# Patient Record
Sex: Male | Born: 1990 | Hispanic: Yes | Marital: Single | Smoking: Never smoker
Health system: Southern US, Community
[De-identification: ages and names within clinical notes are randomized; demographics above are authoritative.]

## PROBLEM LIST (undated history)

## (undated) DIAGNOSIS — J939 Pneumothorax, unspecified: Secondary | ICD-10-CM

---

## 2014-10-28 ENCOUNTER — Inpatient Hospital Stay: Payer: Self-pay | Admitting: Surgery

## 2014-11-11 ENCOUNTER — Ambulatory Visit
Admit: 2014-11-11 | Disposition: A | Discharge status: Home / Self Care | Payer: Self-pay | Attending: Cardiothoracic Surgery | Admitting: Cardiothoracic Surgery

## 2014-12-26 NOTE — Discharge Summary (Signed)
PATIENT NAME:  Darius Wolfe, [name] MR#:  [id number] DATE OF BIRTH:  04-Nov-1990  DATE OF ADMISSION:  10/28/2014 DATE OF DISCHARGE:  10/31/2014  DIAGNOSIS:  Right spontaneous pneumothorax.   PROCEDURE: Right chest tube placement.  HISTORY OF PRESENT ILLNESS AND HOSPITAL COURSE  This was a patient who was admitted through the Emergency Room for the acute onset of right chest pain with shortness of breath. This was seen the day before he came to the Emergency Room. A chest x-ray was obtained in the ER. Dr. Derrill KayGoodman placed a chest tube for right pneumothorax. His chest re-expanded.   Dr. Westley Gamblesim Oaks was consulted for evaluation in followup and he will see the patient in the office as an outpatient.   Serial x-rays demonstrated good inflation of the lung. No air leak was identified on the last day of the hospitalization and chest x-ray showed that there was no residual pneumothorax. Therefore, the chest tube was removed. He was discharged in stable condition on oral analgesics to follow up in Dr. Thelma Bargeaks' office in 10 days.    ____________________________ Adah Salvageichard E. Excell Seltzerooper, MD rec:LT D: 10/31/2014 16:38:43 ET T: 10/31/2014 18:34:48 ET JOB#: [id number]  cc: Adah Salvageichard E. Excell Seltzerooper, MD, <Dictator> Lattie HawICHARD E COOPER MD ELECTRONICALLY SIGNED 11/08/2014 19:40

## 2014-12-26 NOTE — H&P (Signed)
PATIENT NAME:  Darius Wolfe, Roniel MR#:  [id number] DATE OF BIRTH:  Oct 20, 1990  DATE OF ADMISSION:  10/28/2014  CHIEF COMPLAINT: Right chest pain.   HISTORY OF PRESENT ILLNESS: This is a patient who started having acute chest pain in the right side with shortness of breath last night while working out. He came to the Emergency Room today where a diagnosis of a right pneumothorax was identified. The Emergency Room physician placed a right chest tube with partial re-expansion of the lung on chest x-ray. Patient feels much better now. He has never had an episode like this before.   PAST MEDICAL HISTORY: None.   PAST SURGICAL HISTORY: None.   ALLERGIES: None.   MEDICATIONS: None.   FAMILY HISTORY: No history of pneumothorax or lung cancer.   SOCIAL HISTORY: The patient does not smoke or drink. He works in [employer].   REVIEW OF SYSTEMS: A complete system review was performed and negative with the exception of that mentioned in the HPI.   PHYSICAL EXAMINATION: GENERAL: Healthy, comfortable-appearing male patient.  VITAL SIGNS: Stable. He is afebrile.  HEENT: Shows no scleral icterus.  NECK: No palpable neck nodes.  CHEST: Shows bilateral breath sounds; but slightly diminished on the right, there is a large air leak on chest Pleur-evac.  ABDOMEN: Soft, nontender.  EXTREMITIES: Without edema.  NEUROLOGIC: Grossly intact.  INTEGUMENT: No jaundice.   Chest x-rays are personally reviewed showing partial re-expansion especially the lower lobe but the upper lobe does not fully expand; chest tube appears to be in good position.   No laboratory values are available.   ASSESSMENT AND PLAN: This is a patient with a right chest tube for right pneumothorax, spontaneous in nature, no trauma history. I have recommended placement in the hospital controlling his pain, pulmonary toilet, and Pleur-evac suction to reexpand the lung. I will get x-ray in the morning. If there is no lung re-expansion further than what  has been noted already he may need a second chest tube and/or chest CT scan. I will ask Dr. Westley Gamblesim Oaks to see the patient as well in consultation.    ____________________________ Adah Salvageichard E. Excell Seltzerooper, MD rec:nt D: 10/28/2014 18:33:06 ET T: 10/28/2014 19:09:13 ET JOB#: [id number]  cc: Adah Salvageichard E. Excell Seltzerooper, MD, <Dictator> Lattie HawICHARD E Lenette Rau MD ELECTRONICALLY SIGNED 10/29/2014 10:06

## 2015-09-11 ENCOUNTER — Emergency Department: Payer: Managed Care, Other (non HMO)

## 2015-09-11 ENCOUNTER — Inpatient Hospital Stay
Admission: EM | Admit: 2015-09-11 | Discharge: 2015-09-16 | DRG: 165 | Disposition: A | Discharge status: Home / Self Care | Payer: Managed Care, Other (non HMO) | Attending: Surgery | Admitting: Surgery

## 2015-09-11 ENCOUNTER — Encounter: Payer: Self-pay | Admitting: *Deleted

## 2015-09-11 DIAGNOSIS — J939 Pneumothorax, unspecified: Secondary | ICD-10-CM | POA: Diagnosis present

## 2015-09-11 DIAGNOSIS — Z09 Encounter for follow-up examination after completed treatment for conditions other than malignant neoplasm: Secondary | ICD-10-CM

## 2015-09-11 DIAGNOSIS — J930 Spontaneous tension pneumothorax: Principal | ICD-10-CM | POA: Diagnosis present

## 2015-09-11 HISTORY — DX: Pneumothorax, unspecified: J93.9

## 2015-09-11 LAB — CBC
HEMATOCRIT: 48.6 % (ref 40.0–52.0)
Hemoglobin: 16.8 g/dL (ref 13.0–18.0)
MCH: 30.8 pg (ref 26.0–34.0)
MCHC: 34.5 g/dL (ref 32.0–36.0)
MCV: 89.3 fL (ref 80.0–100.0)
PLATELETS: 315 10*3/uL (ref 150–440)
RBC: 5.44 MIL/uL (ref 4.40–5.90)
RDW: 12.3 % (ref 11.5–14.5)
WBC: 5.7 10*3/uL (ref 3.8–10.6)

## 2015-09-11 LAB — BASIC METABOLIC PANEL
Anion gap: 8 (ref 5–15)
BUN: 11 mg/dL (ref 6–20)
CHLORIDE: 105 mmol/L (ref 101–111)
CO2: 28 mmol/L (ref 22–32)
Calcium: 9.3 mg/dL (ref 8.9–10.3)
Creatinine, Ser: 1.01 mg/dL (ref 0.61–1.24)
Glucose, Bld: 106 mg/dL — ABNORMAL HIGH (ref 65–99)
POTASSIUM: 4.1 mmol/L (ref 3.5–5.1)
SODIUM: 141 mmol/L (ref 135–145)

## 2015-09-11 LAB — TROPONIN I: Troponin I: <0.03 ng/mL (ref <0.031)

## 2015-09-11 MED ORDER — MORPHINE SULFATE (PF) 2 MG/ML IV SOLN: 2.0000 mg | INTRAVENOUS | Status: DC | PRN

## 2015-09-11 MED ORDER — CEFAZOLIN SODIUM 1-5 GM-% IV SOLN
1.0000 g | Freq: Once | INTRAVENOUS | Status: AC
  Administered 2015-09-11: 1 g via INTRAVENOUS
  Filled 2015-09-11: qty 50

## 2015-09-11 MED ORDER — MORPHINE SULFATE (PF) 2 MG/ML IV SOLN
INTRAVENOUS | Status: AC
  Filled 2015-09-11: qty 1

## 2015-09-11 MED ORDER — MORPHINE SULFATE (PF) 2 MG/ML IV SOLN: 2.0000 mg | Freq: Once | INTRAVENOUS | Status: DC

## 2015-09-11 MED ORDER — SODIUM CHLORIDE 0.9 % IV SOLN: 250.0000 mL | INTRAVENOUS | Status: DC | PRN

## 2015-09-11 MED ORDER — MORPHINE SULFATE (PF) 2 MG/ML IV SOLN
2.0000 mg | Freq: Once | INTRAVENOUS | Status: AC
  Administered 2015-09-11: 2 mg via INTRAVENOUS

## 2015-09-11 MED ORDER — OXYCODONE HCL 5 MG PO TABS: 5.0000 mg | ORAL_TABLET | ORAL | Status: DC | PRN

## 2015-09-11 MED ORDER — SODIUM CHLORIDE 0.9 % IJ SOLN
3.0000 mL | Freq: Two times a day (BID) | INTRAMUSCULAR | Status: DC
  Administered 2015-09-11 – 2015-09-12 (×3): 3 mL via INTRAVENOUS

## 2015-09-11 MED ORDER — ONDANSETRON HCL 4 MG/2ML IJ SOLN
4.0000 mg | Freq: Once | INTRAMUSCULAR | Status: AC
  Administered 2015-09-11: 4 mg via INTRAVENOUS
  Filled 2015-09-11: qty 2

## 2015-09-11 MED ORDER — SODIUM CHLORIDE 0.9 % IJ SOLN: 3.0000 mL | INTRAMUSCULAR | Status: DC | PRN

## 2015-09-11 MED ORDER — LIDOCAINE-EPINEPHRINE (PF) 1 %-1:200000 IJ SOLN
INTRAMUSCULAR | Status: AC
  Administered 2015-09-11: 30 mL
  Filled 2015-09-11: qty 30

## 2015-09-11 NOTE — H&P (Signed)
Darius Wolfe is an 25 y.o. male.    Chief Complaint: Right pneumothorax  HPI: This patient with a recurrent right pneumothorax he had the acute onset of chest pain yesterday he's had this happen before and in fact required a chest tube placed by [facility] surgical last year. He has some shortness of breath and chest pain. No hemoptysis.  No past medical history on file.  No past surgical history on file.  No family history on file. Social History:  has no tobacco, alcohol, and drug history on file.  Allergies: No Known Allergies   (Not in a hospital admission)   Review of Systems  Constitutional: Negative for fever and chills.  HENT: Negative.   Eyes: Negative.   Respiratory: Positive for shortness of breath. Negative for cough, hemoptysis and sputum production.   Cardiovascular: Positive for chest pain. Negative for palpitations and orthopnea.       Right chest pain  Gastrointestinal: Negative.   Genitourinary: Negative.   Musculoskeletal: Negative.   Skin: Negative.   Neurological: Negative.   Endo/Heme/Allergies: Negative.   Psychiatric/Behavioral: Negative.      Physical Exam:  BP 118/65 mmHg  Pulse 73  Temp(Src) 98.1 F (36.7 C) (Oral)  Resp 15  Ht 5' 6"  (1.676 m)  Wt 156 lb (70.761 kg)  BMI 25.19 kg/m2  SpO2 100%  Physical Exam  Constitutional: He is oriented to person, place, and time and well-developed, well-nourished, and in no distress. No distress.  Appears uncomfortable  HENT:  Head: Normocephalic and atraumatic.  Eyes: Pupils are equal, round, and reactive to light. Right eye exhibits no discharge. Left eye exhibits no discharge. No scleral icterus.  Neck: Normal range of motion. Neck supple.  Cardiovascular: Normal rate, regular rhythm and normal heart sounds.   Pulmonary/Chest: Effort normal and breath sounds normal. No respiratory distress. He has no wheezes. He has no rales.  Right chest wall scar  Abdominal: Soft. He exhibits no distension. There  is no tenderness. There is no rebound.  Musculoskeletal: Normal range of motion. He exhibits no edema.  Lymphadenopathy:    He has no cervical adenopathy.  Neurological: He is alert and oriented to person, place, and time.  Skin: Skin is warm and dry. He is not diaphoretic. No erythema.  Psychiatric: Mood and affect normal.  Vitals reviewed.       Results for orders placed or performed during the hospital encounter of 09/11/15 (from the past 48 hour(s))  Basic metabolic panel     Status: Abnormal   Collection Time: 09/11/15  1:35 PM  Result Value Ref Range   Sodium 141 135 - 145 mmol/L   Potassium 4.1 3.5 - 5.1 mmol/L   Chloride 105 101 - 111 mmol/L   CO2 28 22 - 32 mmol/L   Glucose, Bld 106 (H) 65 - 99 mg/dL   BUN 11 6 - 20 mg/dL   Creatinine, Ser 1.01 0.61 - 1.24 mg/dL   Calcium 9.3 8.9 - 10.3 mg/dL   GFR calc non Af Amer >60 >60 mL/min   GFR calc Af Amer >60 >60 mL/min    Comment: (NOTE) The eGFR has been calculated using the CKD EPI equation. This calculation has not been validated in all clinical situations. eGFR's persistently <60 mL/min signify possible Chronic Kidney Disease.    Anion gap 8 5 - 15  CBC     Status: None   Collection Time: 09/11/15  1:35 PM  Result Value Ref Range   WBC 5.7 3.8 -  10.6 K/uL   RBC 5.44 4.40 - 5.90 MIL/uL   Hemoglobin 16.8 13.0 - 18.0 g/dL   HCT 48.6 40.0 - 52.0 %   MCV 89.3 80.0 - 100.0 fL   MCH 30.8 26.0 - 34.0 pg   MCHC 34.5 32.0 - 36.0 g/dL   RDW 12.3 11.5 - 14.5 %   Platelets 315 150 - 440 K/uL  Troponin I     Status: None   Collection Time: 09/11/15  1:35 PM  Result Value Ref Range   Troponin I <0.03 <0.031 ng/mL    Comment:        NO INDICATION OF MYOCARDIAL INJURY.    Dg Chest 2 View  09/11/2015  CLINICAL DATA:  Chest tightness since 2200 hours last night, pressure on RIGHT side of head and neck, history of prior pneumothorax in March 2016 EXAM: CHEST  2 VIEW COMPARISON:  Multiple prior exams from March 2016  FINDINGS: Normal heart size and mediastinal contours. Large RIGHT pneumothorax with near complete collapse of RIGHT lung. Minimal RIGHT to LEFT mediastinal shift. LEFT lung clear. No pleural effusion or acute osseous findings. IMPRESSION: Large RIGHT pneumothorax with near complete collapse of RIGHT lung. Minimal RIGHT to LEFT midline shift suggesting development of tension pneumothorax. Critical Value/emergent results were called by telephone at the time of interpretation on 09/11/2015 at 2:50 pm to Dr. Lisa Roca, who verbally acknowledged these results. Electronically Signed   By: Lavonia Dana M.D.   On: 09/11/2015 14:52     Assessment/Plan  Current right pneumothorax. Patient had this happen last year requiring a chest tube. He did never met Dr. Faith Rogue at that time. Currently he has a tension pneumothorax and Dr. Rip Harbour is placing a chest tube. Was present during the chest tube placement and have reviewed his postoperative chest film with good inflation of his right lung. This is a recurrent right pneumothorax he will likely require a VATS and pleurodesis. I will admit him to the hospital and maintain chest tube suction and asked Dr. Faith Rogue to see the patient for consideration or evaluation for possible surgery to prevent recurrence. I discussed with the patient the rationale for this and the fact that this is a life-threatening condition untreated and that it should be addressed for permanent long-standing prevention and care  Florene Glen, MD, FACS

## 2015-09-11 NOTE — ED Provider Notes (Signed)
[facility] Emergency Department Provider Note  ____________________________________________  Time seen: Approximately 3:56 PM  I have reviewed the triage vital signs and the nursing notes.   HISTORY  Chief Complaint Chest Pain    HPI Nathaniel ManJuan Diefendorf is a 25 y.o. male comes in complaining of chest pain. He says it feels something like when he had a pneumothorax spontaneous pneumothorax in the right side before. Chest x-ray was done and showed a large right-sided spontaneous pneumothorax. Consent was obtained patient was prepped in usual sterile fashion area was anesthetized with 1% lidocaine with epi incision was made and large Tresa EndoKelly was used to bluntly dissect down over the rib into the chest rush of air was obtained the chest tube was inserted patient tolerated very well. Wound was sutured closed chest tube was tube was sutured into the wound and the chest tube was 2 suction.   No past medical history on file.  Past medical history significant only for right-sided spontaneous pneumothorax  Patient Active Problem List   Diagnosis Date Noted  . Pneumothorax on right 09/11/2015    No past surgical history on file.  No current outpatient prescriptions on file.  Allergies Review of patient's allergies indicates no known allergies.  No family history on file.  Social History Social History  Substance Use Topics  . Smoking status: Not on file  . Smokeless tobacco: Not on file  . Alcohol Use: Not on file   Patient does not smoke Review of Systems Constitutional: No fever/chills Eyes: No visual changes. ENT: No sore throat. Cardiovascular: See history of present illness Respiratory: Denies shortness of breath! Gastrointestinal: No abdominal pain.  No nausea, no vomiting.  No diarrhea.  No constipation. Genitourinary: Negative for dysuria. Musculoskeletal: Negative for back pain. Skin: Negative for rash. Neurological: Negative for headaches, focal  weakness or numbness.  10-point ROS otherwise negative.  ____________________________________________   PHYSICAL EXAM:  VITAL SIGNS: ED Triage Vitals  Enc Vitals Group     BP 09/11/15 1300 136/88 mmHg     Pulse Rate 09/11/15 1300 70     Resp 09/11/15 1300 16     Temp 09/11/15 1300 98.1 F (36.7 C)     Temp Source 09/11/15 1300 Oral     SpO2 09/11/15 1300 98 %     Weight 09/11/15 1300 156 lb (70.761 kg)     Height 09/11/15 1300 5\' 6"  (1.676 m)     Head Cir --      Peak Flow --      Pain Score 09/11/15 1329 2     Pain Loc --      Pain Edu? --      Excl. in GC? --     Constitutional: Alert and oriented. Well appearing and in no acute distress. Eyes: Conjunctivae are normal. PERRL. EOMI. Head: Atraumatic. Nose: No congestion/rhinnorhea. Mouth/Throat: Mucous membranes are moist.  Oropharynx non-erythematous. Neck: No stridor.  Cardiovascular: Normal rate, regular rhythm. Grossly normal heart sounds.  Good peripheral circulation. Respiratory: Normal respiratory effort.  No retractions. Lungs CTAB. Gastrointestinal: Soft and nontender. No distention. No abdominal bruits. No CVA tenderness. Musculoskeletal: No lower extremity tenderness nor edema.  No joint effusions. Neurologic:  Normal speech and language. No gross focal neurologic deficits are appreciated. No gait instability. Skin:  Skin is warm, dry and intact. No rash noted. Psychiatric: Mood and affect are normal. Speech and behavior are normal.  ____________________________________________   LABS (all labs ordered are listed, but only abnormal results are displayed)  Labs Reviewed  BASIC METABOLIC PANEL - Abnormal; Notable for the following:    Glucose, Bld 106 (*)    All other components within normal limits  CBC  TROPONIN I   ____________________________________________  EKG  EKG read and interpreted by me shows normal sinus rhythm rate of 96 normal axis essentially normal  EKG ____________________________________________  RADIOLOGY  Chest x-ray read by radiology reviewed by me shows large right pneumothorax the radiologist is concerned about early tension pneumothorax  Chest x-ray post procedure shows resolution of the pneumothorax with good placement chest tube ____________________________________________   PROCEDURES  See history of present illness for procedure ne  ____________________________________________   INITIAL IMPRESSION / ASSESSMENT AND PLAN / ED COURSE  Pertinent labs & imaging results that were available during my care of the patient were reviewed by me and considered in my medical decision making (see chart for details).   ____________________________________________   FINAL CLINICAL IMPRESSION(S) / ED DIAGNOSES  Final diagnoses:  Pneumothorax on right       Arnaldo Natal, MD 09/11/15 1718

## 2015-09-11 NOTE — ED Notes (Addendum)
Pt reports chest tightness that started this morning around 1030. Increased pain with inspiration. Pt reports last year he had a collapsed right lung. Denies current cold symptoms

## 2015-09-12 ENCOUNTER — Inpatient Hospital Stay: Payer: Managed Care, Other (non HMO)

## 2015-09-12 NOTE — Progress Notes (Signed)
Patient ID: Darius Wolfe, male   DOB: [birth year], 25 y.o.   MRN: [id number]  Chief Complaint  Patient presents with  . Chest Pain    Referred By Dr. Lanelle Balick Cooper Reason for Referral recurrent right sided spontaneous pneumothorax  HPI Location, Quality, Duration, Severity, Timing, Context, Modifying Factors, Associated Signs and Symptoms.  Darius ManJuan Pohle is a 25 y.o. male.  This 25 year old gentleman was in his usual state of health when he was walking across to his room. He experienced the acute onset of some shortness of breath and chest pressure. After a couple minutes he also felt that his neck was swelling and presented to the emergency room where a large right-sided pneumothorax was diagnosed. This was treated with a chest tube with prompt resolution of his symptoms. He had a similar episode about one year ago when he was working out lifting weights and experienced acute onset of right-sided chest pain. At that time he was treated with a chest tube. His lung eventually expanded and he was discharged and has been well ever since. At the current time he denies any chest pain. He denies any shortness of breath. He has had no fevers or chills. He did have a chest x-ray and a CT scan. The CT scan does not show any evidence of blood disease. There is no other pathology present.   Past Medical History  Diagnosis Date  . Pneumothorax     1 year ago    History reviewed. No pertinent past surgical history.  History reviewed. No pertinent family history.  Social History Social History  Substance Use Topics  . Smoking status: Never Smoker   . Smokeless tobacco: None  . Alcohol Use: No    No Known Allergies  Current Facility-Administered Medications  Medication Dose Route Frequency Provider Last Rate Last Dose  . 0.9 %  sodium chloride infusion  250 mL Intravenous PRN Lattie Hawichard E Cooper, MD      . morphine 2 MG/ML injection 2 mg  2 mg Intravenous Once Lattie Hawichard E Cooper, MD   2 mg at 09/11/15 1732   . morphine 2 MG/ML injection 2 mg  2 mg Intravenous Q2H PRN Lattie Hawichard E Cooper, MD      . oxyCODONE (Oxy IR/ROXICODONE) immediate release tablet 5 mg  5 mg Oral Q4H PRN Lattie Hawichard E Cooper, MD      . sodium chloride 0.9 % injection 3 mL  3 mL Intravenous Q12H Lattie Hawichard E Cooper, MD   3 mL at 09/12/15 1000  . sodium chloride 0.9 % injection 3 mL  3 mL Intravenous PRN Lattie Hawichard E Cooper, MD          Review of Systems A complete review of systems was asked and was negative except for the following positive findings acute onset of shortness of breath, chest pressure and a feeling of pain in his neck. All this has resolved with the chest tube.  Blood pressure 125/64, pulse 70, temperature 98.1 F (36.7 C), temperature source Oral, resp. rate 17, height 5\' 6"  (1.676 m), weight 156 lb (70.761 kg), SpO2 100 %.  Physical Exam CONSTITUTIONAL:  Pleasant, well-developed, well-nourished, and in no acute distress. EYES: Pupils equal and reactive to light, Sclera non-icteric EARS, NOSE, MOUTH AND THROAT:  The oropharynx was clear.  Dentition is good repair.  Oral mucosa pink and moist. LYMPH NODES:  Lymph nodes in the neck and axillae were normal RESPIRATORY:  Lungs were clear.  Normal respiratory effort without pathologic use of accessory muscles of  respiration CARDIOVASCULAR: Heart was regular without murmurs.  There were no carotid bruits. MUSCULOSKELETAL:  Normal muscle strength and tone.  No clubbing or cyanosis.   SKIN:  There were no pathologic skin lesions.  There were no nodules on palpation. NEUROLOGIC:  Sensation is normal.  Cranial nerves are grossly intact. PSYCH:  Oriented to person, place and time.  Mood and affect are normal.  Data Reviewed I have independently reviewed the chest x-ray and CT scan  I have personally reviewed the patient's imaging, laboratory findings and medical records.    Assessment    Recurrent right-sided spontaneous pneumothorax.    Plan    I had a long  discussion today with the patient and his father. His mother and grandmother were present in the room but did not speak [place]. He and his father understand English well. I reviewed with him the indications and risks of right-sided thoracoscopy possible thoracotomy with apical blebectomy and mechanical pleurodesis. He was apprised of the options. Given that this is his second episode he would like proceed with surgical intervention. We will plan on performing that tomorrow.       Hulda Marin, MD 09/12/2015, 6:29 PM

## 2015-09-13 ENCOUNTER — Inpatient Hospital Stay: Payer: Managed Care, Other (non HMO)

## 2015-09-13 ENCOUNTER — Encounter
Admission: EM | Disposition: A | Discharge status: Home / Self Care | Payer: Self-pay | Source: Home / Self Care | Attending: Surgery

## 2015-09-13 ENCOUNTER — Inpatient Hospital Stay: Payer: Managed Care, Other (non HMO) | Admitting: Anesthesiology

## 2015-09-13 ENCOUNTER — Encounter: Payer: Self-pay | Admitting: *Deleted

## 2015-09-13 DIAGNOSIS — J939 Pneumothorax, unspecified: Secondary | ICD-10-CM

## 2015-09-13 HISTORY — PX: VIDEO ASSISTED THORACOSCOPY: SHX5073

## 2015-09-13 LAB — MRSA PCR SCREENING: MRSA BY PCR: NEGATIVE

## 2015-09-13 SURGERY — VIDEO ASSISTED THORACOSCOPY
Anesthesia: General | Laterality: Right | Wound class: Clean Contaminated

## 2015-09-13 MED ORDER — ACETAMINOPHEN 10 MG/ML IV SOLN
INTRAVENOUS | Status: AC
Start: 2015-09-13 — End: 2015-09-13
  Filled 2015-09-13: qty 100

## 2015-09-13 MED ORDER — BISACODYL 5 MG PO TBEC
10.0000 mg | DELAYED_RELEASE_TABLET | Freq: Every day | ORAL | Status: DC
  Administered 2015-09-13 – 2015-09-15 (×3): 10 mg via ORAL
  Filled 2015-09-13 (×3): qty 2

## 2015-09-13 MED ORDER — BUPIVACAINE HCL (PF) 0.25 % IJ SOLN
INTRAMUSCULAR | Status: DC | PRN
  Administered 2015-09-13: 20 mL

## 2015-09-13 MED ORDER — ALBUTEROL SULFATE (2.5 MG/3ML) 0.083% IN NEBU
2.5000 mg | INHALATION_SOLUTION | RESPIRATORY_TRACT | Status: DC
  Administered 2015-09-13 – 2015-09-14 (×2): 2.5 mg via RESPIRATORY_TRACT
  Filled 2015-09-13 (×2): qty 3

## 2015-09-13 MED ORDER — ALBUTEROL SULFATE (2.5 MG/3ML) 0.083% IN NEBU: 2.5000 mg | INHALATION_SOLUTION | RESPIRATORY_TRACT | Status: DC

## 2015-09-13 MED ORDER — SUGAMMADEX SODIUM 200 MG/2ML IV SOLN
INTRAVENOUS | Status: DC | PRN
  Administered 2015-09-13: 150 mg via INTRAVENOUS

## 2015-09-13 MED ORDER — PROPOFOL 10 MG/ML IV BOLUS
INTRAVENOUS | Status: DC | PRN
  Administered 2015-09-13: 200 mg via INTRAVENOUS

## 2015-09-13 MED ORDER — BUPIVACAINE LIPOSOME 1.3 % IJ SUSP
INTRAMUSCULAR | Status: AC
  Filled 2015-09-13: qty 20

## 2015-09-13 MED ORDER — FENTANYL CITRATE (PF) 100 MCG/2ML IJ SOLN
INTRAMUSCULAR | Status: DC | PRN
  Administered 2015-09-13 (×2): 50 ug via INTRAVENOUS
  Administered 2015-09-13: 100 ug via INTRAVENOUS
  Administered 2015-09-13 (×4): 50 ug via INTRAVENOUS

## 2015-09-13 MED ORDER — TALC 5 G PL SUSR
INTRAPLEURAL | Status: AC
  Filled 2015-09-13: qty 5

## 2015-09-13 MED ORDER — MIDAZOLAM HCL 2 MG/2ML IJ SOLN
INTRAMUSCULAR | Status: DC | PRN
  Administered 2015-09-13: 2 mg via INTRAVENOUS

## 2015-09-13 MED ORDER — SODIUM CHLORIDE 0.9 % IJ SOLN
INTRAMUSCULAR | Status: AC
  Filled 2015-09-13: qty 50

## 2015-09-13 MED ORDER — LACTATED RINGERS IV SOLN
INTRAVENOUS | Status: DC
  Administered 2015-09-13: 13:00:00 via INTRAVENOUS

## 2015-09-13 MED ORDER — LIDOCAINE HCL (CARDIAC) 20 MG/ML IV SOLN
INTRAVENOUS | Status: DC | PRN
  Administered 2015-09-13: 50 mg via INTRAVENOUS

## 2015-09-13 MED ORDER — DEXTROSE-NACL 5-0.45 % IV SOLN
INTRAVENOUS | Status: DC
  Administered 2015-09-14 (×2): via INTRAVENOUS

## 2015-09-13 MED ORDER — ONDANSETRON HCL 4 MG/2ML IJ SOLN: 4.0000 mg | Freq: Four times a day (QID) | INTRAMUSCULAR | Status: DC | PRN

## 2015-09-13 MED ORDER — ACETAMINOPHEN 10 MG/ML IV SOLN
INTRAVENOUS | Status: DC | PRN
  Administered 2015-09-13: 1000 mg via INTRAVENOUS

## 2015-09-13 MED ORDER — OXYCODONE HCL 5 MG PO TABS
5.0000 mg | ORAL_TABLET | ORAL | Status: DC | PRN
  Administered 2015-09-13 – 2015-09-14 (×2): 10 mg via ORAL
  Filled 2015-09-13 (×2): qty 2

## 2015-09-13 MED ORDER — LACTATED RINGERS IV SOLN
INTRAVENOUS | Status: DC | PRN
  Administered 2015-09-13: 13:00:00 via INTRAVENOUS

## 2015-09-13 MED ORDER — ROCURONIUM BROMIDE 100 MG/10ML IV SOLN
INTRAVENOUS | Status: DC | PRN
  Administered 2015-09-13: 50 mg via INTRAVENOUS
  Administered 2015-09-13 (×2): 10 mg via INTRAVENOUS
  Administered 2015-09-13: 20 mg via INTRAVENOUS

## 2015-09-13 MED ORDER — ONDANSETRON HCL 4 MG/2ML IJ SOLN
INTRAMUSCULAR | Status: DC | PRN
  Administered 2015-09-13 (×2): 4 mg via INTRAVENOUS

## 2015-09-13 MED ORDER — HYDROMORPHONE HCL 1 MG/ML IJ SOLN: 0.2500 mg | INTRAMUSCULAR | Status: DC | PRN

## 2015-09-13 MED ORDER — MINERAL OIL LIGHT 100 % EX OIL
TOPICAL_OIL | CUTANEOUS | Status: AC
  Filled 2015-09-13: qty 25

## 2015-09-13 MED ORDER — BUPIVACAINE HCL (PF) 0.25 % IJ SOLN
INTRAMUSCULAR | Status: AC
  Filled 2015-09-13: qty 30

## 2015-09-13 MED ORDER — MORPHINE SULFATE (PF) 2 MG/ML IV SOLN
1.0000 mg | INTRAVENOUS | Status: DC | PRN
  Administered 2015-09-13: 1 mg via INTRAVENOUS
  Administered 2015-09-16: 2 mg via INTRAVENOUS
  Filled 2015-09-13: qty 1
  Filled 2015-09-13: qty 2

## 2015-09-13 MED ORDER — DEXAMETHASONE SODIUM PHOSPHATE 4 MG/ML IJ SOLN
INTRAMUSCULAR | Status: DC | PRN
  Administered 2015-09-13: 5 mg via INTRAVENOUS

## 2015-09-13 MED ORDER — DEXTROSE 5 % IV SOLN
1.5000 g | Freq: Two times a day (BID) | INTRAVENOUS | Status: AC
  Administered 2015-09-13 – 2015-09-14 (×2): 1.5 g via INTRAVENOUS
  Filled 2015-09-13 (×2): qty 1.5

## 2015-09-13 MED ORDER — FENTANYL CITRATE (PF) 100 MCG/2ML IJ SOLN: 25.0000 ug | INTRAMUSCULAR | Status: DC | PRN

## 2015-09-13 MED ORDER — ONDANSETRON HCL 4 MG/2ML IJ SOLN: 4.0000 mg | Freq: Once | INTRAMUSCULAR | Status: DC | PRN

## 2015-09-13 MED ORDER — CEFAZOLIN SODIUM 1 G IJ SOLR
INTRAMUSCULAR | Status: DC | PRN
  Administered 2015-09-13: 2 g via INTRAMUSCULAR

## 2015-09-13 SURGICAL SUPPLY — 68 items
BNDG COHESIVE 4X5 TAN STRL (GAUZE/BANDAGES/DRESSINGS) IMPLANT
BRONCHOSCOPE PED SLIM DISP (MISCELLANEOUS) ×2 IMPLANT
CANISTER SUCT 1200ML W/VALVE (MISCELLANEOUS) ×2 IMPLANT
CATH THOR STR 28F  SOFT WA (CATHETERS) ×1
CATH THOR STR 28F SOFT WA (CATHETERS) ×1 IMPLANT
CATH TRAY 16F METER LATEX (MISCELLANEOUS) IMPLANT
CATH URET ROBINSON 16FR STRL (CATHETERS) IMPLANT
CHLORAPREP W/TINT 26ML (MISCELLANEOUS) ×4 IMPLANT
CNTNR SPEC 2.5X3XGRAD LEK (MISCELLANEOUS)
CONT SPEC 4OZ STER OR WHT (MISCELLANEOUS)
CONTAINER SPEC 2.5X3XGRAD LEK (MISCELLANEOUS) IMPLANT
CUTTER ECHEON FLEX ENDO 45 340 (ENDOMECHANICALS) ×2 IMPLANT
DEFOGGER SCOPE WARMER CLEARIFY (MISCELLANEOUS) ×2 IMPLANT
DRAIN CHEST DRY SUCT SGL (MISCELLANEOUS) ×2 IMPLANT
DRAPE C-SECTION (MISCELLANEOUS) ×2 IMPLANT
DRAPE MAG INST 16X20 L/F (DRAPES) IMPLANT
DRESSING TELFA 4X3 1S ST N-ADH (GAUZE/BANDAGES/DRESSINGS) IMPLANT
DRSG TEGADERM 2-3/8X2-3/4 SM (GAUZE/BANDAGES/DRESSINGS) IMPLANT
DRSG TEGADERM 6X8 (GAUZE/BANDAGES/DRESSINGS) IMPLANT
DRSG TELFA 3X8 NADH (GAUZE/BANDAGES/DRESSINGS) IMPLANT
ELECT BLADE 6 FLAT ULTRCLN (ELECTRODE) ×2 IMPLANT
ELECT BLADE 6.5 EXT (BLADE) ×2 IMPLANT
ELECT CAUTERY BLADE TIP 2.5 (TIP) ×2
ELECT REM PT RETURN 9FT ADLT (ELECTROSURGICAL) ×2
ELECTRODE CAUTERY BLDE TIP 2.5 (TIP) ×1 IMPLANT
ELECTRODE REM PT RTRN 9FT ADLT (ELECTROSURGICAL) ×1 IMPLANT
GAUZE SPONGE 4X4 12PLY STRL (GAUZE/BANDAGES/DRESSINGS) ×2 IMPLANT
GLOVE EXAM LX STRL 7.5 (GLOVE) ×12 IMPLANT
GOWN STRL REUS W/ TWL LRG LVL3 (GOWN DISPOSABLE) ×4 IMPLANT
GOWN STRL REUS W/TWL LRG LVL3 (GOWN DISPOSABLE) ×4
KIT RM TURNOVER STRD PROC AR (KITS) ×2 IMPLANT
LABEL OR SOLS (LABEL) IMPLANT
LOOP RED MAXI  1X406MM (MISCELLANEOUS)
LOOP VESSEL MAXI 1X406 RED (MISCELLANEOUS) IMPLANT
MARKER SKIN W/RULER 31145785 (MISCELLANEOUS) ×2 IMPLANT
NEEDLE SPNL 20GX3.5 QUINCKE YW (NEEDLE) ×2 IMPLANT
NS IRRIG 500ML POUR BTL (IV SOLUTION) ×2 IMPLANT
PACK BASIN MAJOR ARMC (MISCELLANEOUS) ×2 IMPLANT
RELOAD GOLD ECHELON 45 (STAPLE) ×6 IMPLANT
RELOAD STAPLER LINE PROX 30 GR (STAPLE) IMPLANT
SCISSORS METZENBAUM CVD 33 (INSTRUMENTS) ×2 IMPLANT
SPONGE KITTNER 5P (MISCELLANEOUS) ×2 IMPLANT
STAPLER RELOAD LINE PROX 30 GR (STAPLE)
STAPLER SKIN PROX 35W (STAPLE) IMPLANT
STAPLER VASCULAR ECHELON 35 (CUTTER) IMPLANT
STRIP CLOSURE SKIN 1/2X4 (GAUZE/BANDAGES/DRESSINGS) ×2 IMPLANT
SUT ETHILON 4-0 (SUTURE) ×1
SUT ETHILON 4-0 FS2 18XMFL BLK (SUTURE) ×1
SUT SILK 0 (SUTURE)
SUT SILK 0 30XBRD TIE 6 (SUTURE) IMPLANT
SUT SILK 1 SH (SUTURE) ×10 IMPLANT
SUT VIC AB 0 CT1 36 (SUTURE) ×4 IMPLANT
SUT VIC AB 2-0 CT1 27 (SUTURE)
SUT VIC AB 2-0 CT1 TAPERPNT 27 (SUTURE) IMPLANT
SUT VIC AB 2-0 CT2 27 (SUTURE) ×4 IMPLANT
SUT VIC AB 4-0 FS2 27 (SUTURE) IMPLANT
SUT VICRYL 2 TP 1 (SUTURE) IMPLANT
SUTURE ETHLN 4-0 FS2 18XMF BLK (SUTURE) ×1 IMPLANT
SYR 10ML SLIP (SYRINGE) IMPLANT
SYR 50ML LL SCALE MARK (SYRINGE) ×2 IMPLANT
SYR BULB IRRIG 60ML STRL (SYRINGE) ×2 IMPLANT
TAPE ADH 3 LX (MISCELLANEOUS) ×2 IMPLANT
TAPE TRANSPORE STRL 2 31045 (GAUZE/BANDAGES/DRESSINGS) IMPLANT
TROCAR FLEXIPATH 20X80 (ENDOMECHANICALS) ×2 IMPLANT
TROCAR FLEXIPATH THORACIC 15MM (ENDOMECHANICALS) ×4 IMPLANT
TUBING CONNECTING 10 (TUBING) IMPLANT
WATER STERILE IRR 1000ML POUR (IV SOLUTION) ×2 IMPLANT
YANKAUER SUCT BULB TIP FLEX NO (MISCELLANEOUS) ×2 IMPLANT

## 2015-09-13 NOTE — Brief Op Note (Signed)
09/11/2015 - 09/13/2015  3:53 PM  PATIENT:  Darius Wolfe  25 y.o. male  PRE-OPERATIVE DIAGNOSIS:  right pneumothorax  POST-OPERATIVE DIAGNOSIS:  right pneumothorax  PROCEDURE:  Procedure(s): VIDEO ASSISTED THORACOSCOPY, BLEBECTOMY, MECHANICAL PLEUORDESIS (Right)  SURGEON:  Surgeon(s) and Role:    * Hulda Marin, MD - Primary    * Gladis Riffle, MD - Assisting  PHYSICIAN ASSISTANT:   ASSISTANTS: {ASSISTANTS:31  ANESTHESIA:   general  EBL:  Total I/O In: 1000 [I.V.:1000] Out: 346 [Urine:300; Chest Tube:46]  BLOOD ADMINISTERED:none  DRAINS: 1 Chest Tube(s) in the pleural space   LOCAL MEDICATIONS USED:  MARCAINE     SPECIMEN:  Source of Specimen:  right lung  DISPOSITION OF SPECIMEN:  PATHOLOGY  COUNTS:  YES  TOURNIQUET:  * No tourniquets in log *  DICTATION: .Dragon Dictation  PLAN OF CARE: Admit to inpatient   PATIENT DISPOSITION:  PACU - hemodynamically stable.   Delay start of Pharmacological VTE agent (>24hrs) due to surgical blood loss or risk of bleeding: yes

## 2015-09-13 NOTE — Op Note (Signed)
  09/11/2015 - 09/13/2015  4:14 PM  PATIENT:  Darius Wolfe  25 y.o. male  PRE-OPERATIVE DIAGNOSIS:  Recurrent pneumothorax right side  POST-OPERATIVE DIAGNOSIS:  Same  PROCEDURE:  Preoperative bronchoscopy to assess endobronchial anatomy with right thoracoscopy and apical blebectomy with mechanical pleurodesis  SURGEON:  Surgeon(s) and Role:    * Hulda Marin, MD - Primary    * Gladis Riffle, MD - Assisting  ASSISTANTS: Lorin Glass PAS  ANESTHESIA: Gen.  INDICATIONS FOR PROCEDURE this patient has had 2 spontaneous pneumothoraces on the right over the last year. The indications were explained to the patient gave his informed consent  DICTATION: Patient brought to the operating suite and placed in the supine position. General endotracheal anesthesia was given with a double-lumen tube. Preoperative bronchoscopy was carried out and was normal to the subsegmental levels bilaterally. Patient was then turned for a right thoracoscopy. All pressure points were carefully padded. The previous chest tube was removed after the right lung was deflated. Patient was then prepped and draped in usual sterile fashion. 3 ports were created in a triangular fashion on the lower aspect of the right hemithorax. We avoided the prior chest tube site. Through these 3 ports we had good working ports and a good visualization of the entire lung. Careful thoracoscopy of the entire lung revealed no evidence of bleb disease. There was one small focal area at the very apex of the lung that may have represented some old scar. Using several firings of an endo thoracic stapler the apex of the right lung was resected. Careful inspection of the superior segment of the lower lobe as well as all aspects of the middle lobe and anterior aspects of the upper lobe were visualized and no additional bleb disease was noted.  We then performed a mechanical pleurodesis using the Bovie scratch pad. This was performed from the second  interspace down as far as we could see easily. The area along the internal mammary artery was avoided.    We then placed a single 28 French chest tube through our most inferior port and brought it out through a separate incision. The chest tube was secured with silk sutures. The port sites were dry. Complete hemostasis was obtained. The chest was irrigated. There is no air leak from the apex of the lung.  The wounds were then closed in multiple layers. 0 Vicryl or 2-0 Vicryl was used on the muscles and subcutaneous tissues. The most anterior port site was closed with a running Monocryl. The other port sites were closed with a running nylon. Sterile dressings were then applied.  The patient was then rolled into the supine position where he was extubated and taken to the recovery room in stable condition. All sponge needle and ensuring counts were correct as reported to me at the end of the case. Estimated blood loss was approximately 20 cc.   Hulda Marin, MD

## 2015-09-13 NOTE — Progress Notes (Signed)
I have reviewed the history and physical and there are no changes.  The patient understands the proposed procedure and all questions answered.  Audreana Hancox, MD  

## 2015-09-13 NOTE — Transfer of Care (Signed)
Immediate Anesthesia Transfer of Care Note  Patient: Darius Wolfe  Procedure(s) Performed: Procedure(s): VIDEO ASSISTED THORACOSCOPY, BLEBECTOMY, MECHANICAL PLEUORDESIS (Right)  Patient Location: PACU  Anesthesia Type:General  Level of Consciousness: sedated  Airway & Oxygen Therapy: Patient Spontanous Breathing and Patient connected to face mask oxygen  Post-op Assessment: Report given to RN and Post -op Vital signs reviewed and stable  Post vital signs: Reviewed and stable  Last Vitals:  Filed Vitals:   09/13/15 0945 09/13/15 1223  BP:  132/63  Pulse:  68  Temp:  36.7 C  Resp: 17 16    Complications: No apparent anesthesia complications

## 2015-09-13 NOTE — Anesthesia Preprocedure Evaluation (Signed)
Anesthesia Evaluation  Patient identified by MRN, date of birth, ID band Patient awake    Reviewed: Allergy & Precautions, H&P , NPO status , Patient's Chart, lab work & pertinent test results, reviewed documented beta blocker date and time   Airway Mallampati: II  TM Distance: >3 FB Neck ROM: full    Dental  (+) Teeth Intact   Pulmonary neg pulmonary ROS,    Pulmonary exam normal        Cardiovascular negative cardio ROS Normal cardiovascular exam Rhythm:regular Rate:Normal     Neuro/Psych negative neurological ROS  negative psych ROS   GI/Hepatic negative GI ROS, Neg liver ROS,   Endo/Other  negative endocrine ROS  Renal/GU negative Renal ROS  negative genitourinary   Musculoskeletal   Abdominal   Peds  Hematology negative hematology ROS (+)   Anesthesia Other Findings Past Medical History:   Pneumothorax                                                   Comment:1 year ago History reviewed. No pertinent surgical history. BMI    Body Mass Index   25.19 kg/m 2     Reproductive/Obstetrics negative OB ROS                             Anesthesia Physical Anesthesia Plan  ASA: II  Anesthesia Plan: General ETT   Post-op Pain Management:    Induction:   Airway Management Planned:   Additional Equipment:   Intra-op Plan:   Post-operative Plan:   Informed Consent: I have reviewed the patients History and Physical, chart, labs and discussed the procedure including the risks, benefits and alternatives for the proposed anesthesia with the patient or authorized representative who has indicated his/her understanding and acceptance.   Dental Advisory Given  Plan Discussed with: CRNA  Anesthesia Plan Comments:         Anesthesia Quick Evaluation

## 2015-09-13 NOTE — Anesthesia Procedure Notes (Addendum)
Procedure Name: Intubation Date/Time: 09/13/2015 1:21 PM Performed by: Mathews Argyle Pre-anesthesia Checklist: Patient identified, Patient being monitored, Timeout performed, Emergency Drugs available and Suction available Patient Re-evaluated:Patient Re-evaluated prior to inductionOxygen Delivery Method: Circle system utilized Preoxygenation: Pre-oxygenation with 100% oxygen Intubation Type: IV induction Ventilation: Mask ventilation without difficulty Laryngoscope Size: Miller and 2 Grade View: Grade I Tube type: Oral Endobronchial tube: Left, Double lumen EBT, EBT position confirmed by auscultation and EBT position confirmed by fiberoptic bronchoscope and 37 Fr Number of attempts: 1 Airway Equipment and Method: Stylet Placement Confirmation: ETT inserted through vocal cords under direct vision,  positive ETCO2 and breath sounds checked- equal and bilateral Secured at: 27 cm Tube secured with: Tape Dental Injury: Teeth and Oropharynx as per pre-operative assessment

## 2015-09-14 ENCOUNTER — Encounter: Payer: Self-pay | Admitting: Cardiothoracic Surgery

## 2015-09-14 MED ORDER — ALBUTEROL SULFATE (2.5 MG/3ML) 0.083% IN NEBU
2.5000 mg | INHALATION_SOLUTION | Freq: Two times a day (BID) | RESPIRATORY_TRACT | Status: DC
  Administered 2015-09-14 – 2015-09-15 (×3): 2.5 mg via RESPIRATORY_TRACT
  Filled 2015-09-14 (×4): qty 3

## 2015-09-14 NOTE — Progress Notes (Signed)
RT assessment revealed a 3 on the severity assessment. According to the recommendations the albuterol nebulizer treatments have been changed to BID.

## 2015-09-14 NOTE — Progress Notes (Signed)
Visited with the patient. He is having minimal pain. Dressing is noted to be dry. Tolerating regular diet. Plans per Dr. Inez Catalina

## 2015-09-14 NOTE — Progress Notes (Signed)
[facility] Post-Op Note  Patient ID: Darius Wolfe, male   DOB: [birth year], 25 y.o.   MRN: [id number]  HISTORY: Not short of breath and minimal pain.  Eating well.   Filed Vitals:   09/14/15 1040 09/14/15 1404  BP: 127/66 114/62  Pulse: 96 83  Temp: 99.1 F (37.3 C) 99.4 F (37.4 C)  Resp: 16      EXAM: Resp: Lungs are clear bilaterally but low lung volumes.  No respiratory distress, normal effort. Heart:  Regular without murmurs  No air leak but poor cough effort   ASSESSMENT: No problems.   PLAN:   Chest tube to remain on suction today. We ambulated in halls without problems today Check on Pathology tomorrow     Hulda Marin, MD

## 2015-09-15 ENCOUNTER — Inpatient Hospital Stay: Payer: Managed Care, Other (non HMO)

## 2015-09-15 NOTE — Progress Notes (Signed)
Darius Wolfe Inpatient Post-Op Note  Patient ID: Darius Wolfe, male   DOB: June 02, 1991, 25 y.o.   MRN: [id number]  HISTORY: He states he had a quiet night. He denies any significant pain or shortness of breath. He has not noticed any air leak from his chest tube.   Filed Vitals:   09/15/15 0026 09/15/15 0537  BP: 117/66 118/74  Pulse: 88 88  Temp: 98.4 F (36.9 C) 98.4 F (36.9 C)  Resp: 18 17     EXAM: Resp: Lungs are clear bilaterally.  No respiratory distress, normal effort. Heart:  Regular without murmurs Abd:  Abdomen is soft, non distended and non tender. No masses are palpable.  There is no rebound and no guarding.  Neurological: Alert and oriented to person, place, and time. Coordination normal.  Skin: Skin is warm and dry. No rash noted. No diaphoretic. No erythema. No pallor.  Psychiatric: Normal mood and affect. Normal behavior. Judgment and thought content normal.   I redressed all of his wounds. They'll look clean dry and intact. There is no erythema or drainage.   ASSESSMENT: There is no air leak today on exam. We will obtain a chest x-ray. If the chest x-ray looks good we will consider waterseal placement.   PLAN:   Chest tube to waterseal today of x-ray looks good.    Hulda Marin, MD

## 2015-09-16 ENCOUNTER — Inpatient Hospital Stay: Payer: Managed Care, Other (non HMO)

## 2015-09-16 LAB — SURGICAL PATHOLOGY

## 2015-09-16 NOTE — Discharge Summary (Signed)
Physician Discharge Summary  Patient ID: Darius Wolfe MRN: [id number] DOB/AGE: Sep 11, 1990 24 y.o.  Admit date: 09/11/2015 Discharge date: 09/16/2015   Discharge Diagnoses:  Active Problems:   Pneumothorax on right   Procedures:  Right thoracoscopy with blebectomy   Hospital Course: No issues.  Tolerated well.  Chest tube removed on postoperative day 3 and he did well after that.  I reviewed the CXRay with the radiologist and we did not see any difference in pre and post pull CXRay.  Disposition:   Discharge Instructions    Diet - low sodium heart healthy    Complete by:  As directed      Discharge instructions    Complete by:  As directed   Call for fever, chills, shortness of breath, significant pain.  [facility].  Will need a chest xray prior to visit.     Increase activity slowly    Complete by:  As directed             Medication List    Notice    You have not been prescribed any medications.       Hulda Marin, MD

## 2015-09-16 NOTE — Anesthesia Postprocedure Evaluation (Signed)
Anesthesia Post Note  Patient: Darius Wolfe  Procedure(s) Performed: Procedure(s) (LRB): VIDEO ASSISTED THORACOSCOPY, BLEBECTOMY, MECHANICAL PLEUORDESIS (Right)  Patient location during evaluation: PACU Anesthesia Type: General Level of consciousness: awake and alert Pain management: pain level controlled Vital Signs Assessment: post-procedure vital signs reviewed and stable Respiratory status: spontaneous breathing, nonlabored ventilation, respiratory function stable and patient connected to nasal cannula oxygen Cardiovascular status: blood pressure returned to baseline and stable Postop Assessment: no signs of nausea or vomiting Anesthetic complications: no    Last Vitals:  Filed Vitals:   09/16/15 0617 09/16/15 0953  BP: 124/61 121/55  Pulse: 65 80  Temp: 36.9 C 36.8 C  Resp: 24     Last Pain:  Filed Vitals:   09/16/15 0953  PainSc: 3                  Yevette Edwards

## 2015-09-21 ENCOUNTER — Other Ambulatory Visit: Payer: Self-pay | Admitting: *Deleted

## 2015-09-21 DIAGNOSIS — J9383 Other pneumothorax: Secondary | ICD-10-CM

## 2015-09-22 ENCOUNTER — Inpatient Hospital Stay: Payer: Managed Care, Other (non HMO) | Attending: Cardiothoracic Surgery | Admitting: Cardiothoracic Surgery

## 2015-09-22 ENCOUNTER — Ambulatory Visit
Admission: RE | Admit: 2015-09-22 | Discharge: 2015-09-22 | Disposition: A | Discharge status: Home / Self Care | Payer: Managed Care, Other (non HMO) | Source: Ambulatory Visit | Attending: Cardiothoracic Surgery | Admitting: Cardiothoracic Surgery

## 2015-09-22 VITALS — BP 123/74 | HR 61 | Temp 97.0°F | Wt 153.4 lb

## 2015-09-22 DIAGNOSIS — J9383 Other pneumothorax: Secondary | ICD-10-CM | POA: Diagnosis present

## 2015-09-22 NOTE — Progress Notes (Signed)
He comes in today having tolerated the surgical procedures well. He's only having mild discomfort at the incision sites. All of his wounds today look quite good. There is no erythema or drainage. His lungs are clear bilaterally. His heart regular. We did remove all the sutures today. We Steri-Stripped his wound. We will obtain a chest x-ray today. He will come back to see me as needed.

## 2015-10-06 ENCOUNTER — Ambulatory Visit: Payer: Managed Care, Other (non HMO) | Admitting: Cardiothoracic Surgery

## 2015-10-06 ENCOUNTER — Inpatient Hospital Stay: Payer: Managed Care, Other (non HMO) | Attending: Cardiothoracic Surgery | Admitting: Cardiothoracic Surgery

## 2017-01-18 IMAGING — CT CT CHEST W/O CM
1 of 2 series · 14 of 32 positions shown, 18 images · non-contrast
Comparison: Chest radiograph 09/12/2015.

CLINICAL DATA: Recurrent right pneumothorax, acute onset of chest
pain for 2 days. Shortness of breath.

EXAM:
CT CHEST WITHOUT CONTRAST
TECHNIQUE: Multidetector CT imaging of the chest was performed following the
standard protocol without IV contrast.

[Series 2: routine chest wo · axial · 0.70mm/px · z∈[-20,+244]mm · 14 of 63 slices shown, 18 images]
[im 5/63  mediastinal]
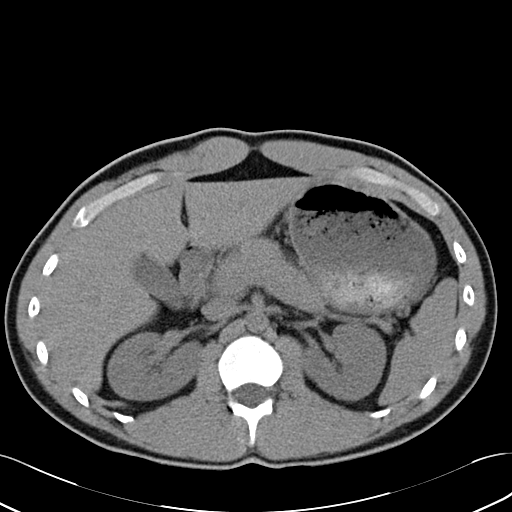
[im 5/63  lung]
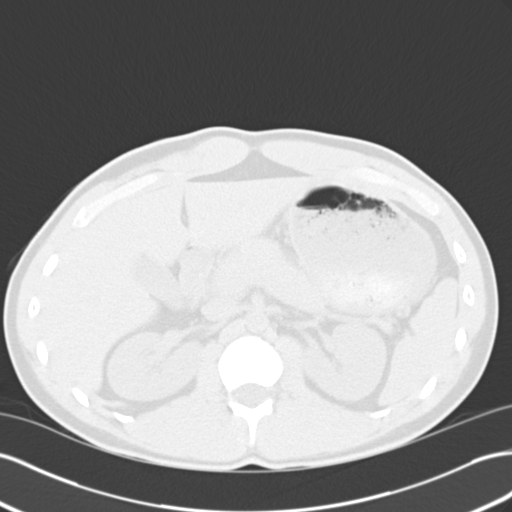
[im 10/63  lung]
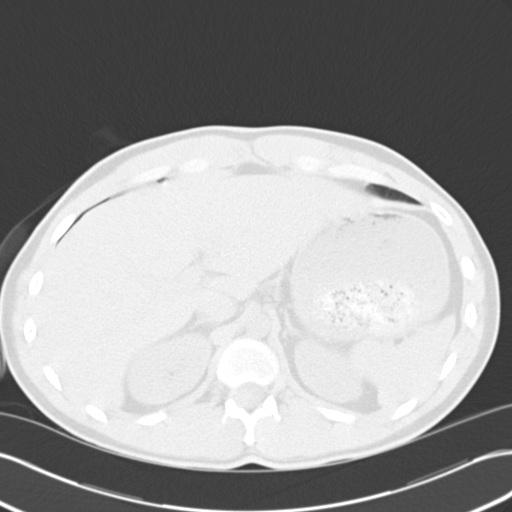
[im 15/63  lung]
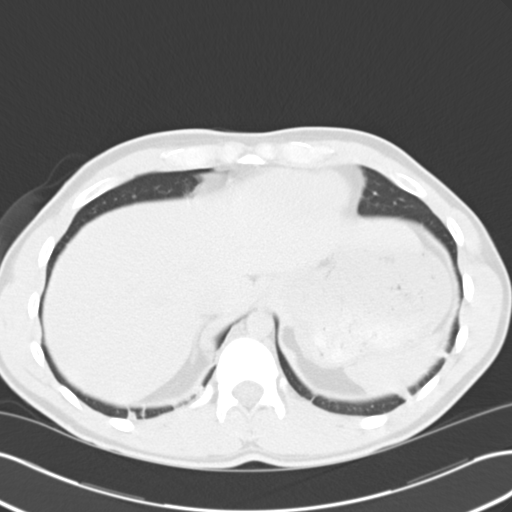
[im 20/63  lung]
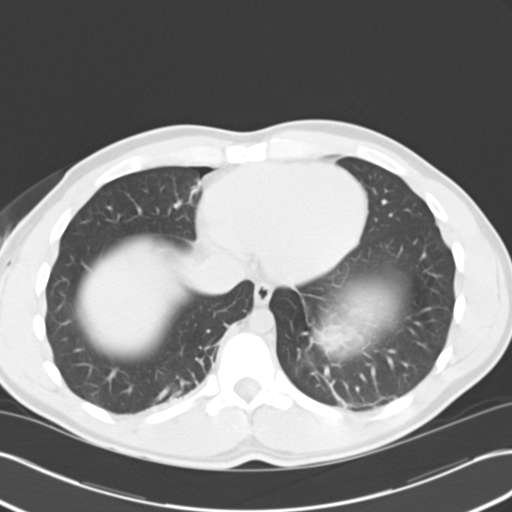
[im 24/63  mediastinal]
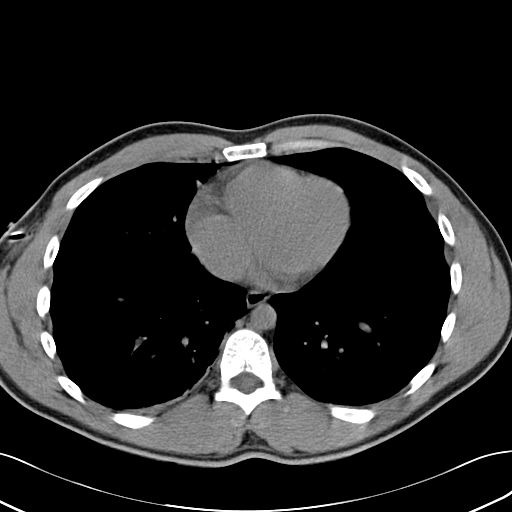
[im 24/63  lung]
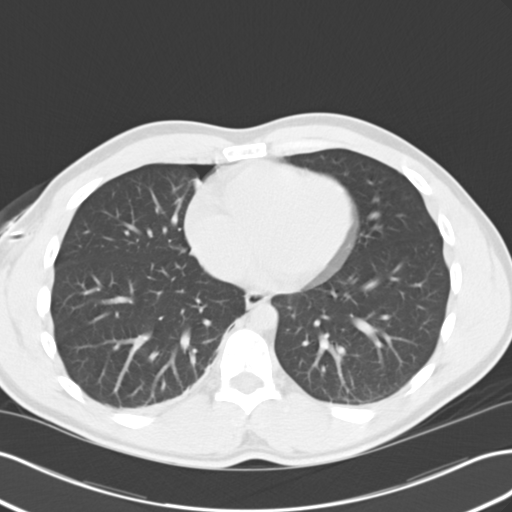
[im 29/63  lung]
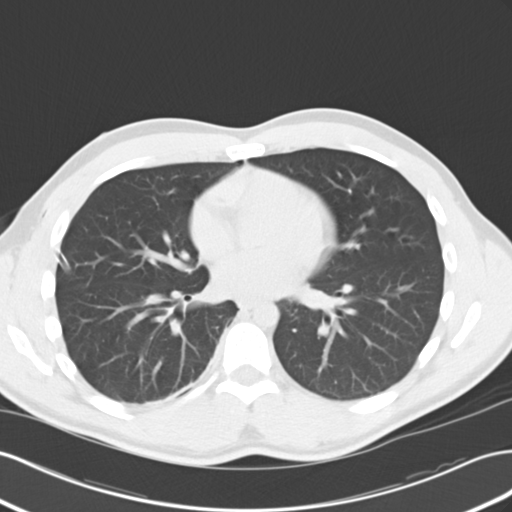
[im 30/63  lung]
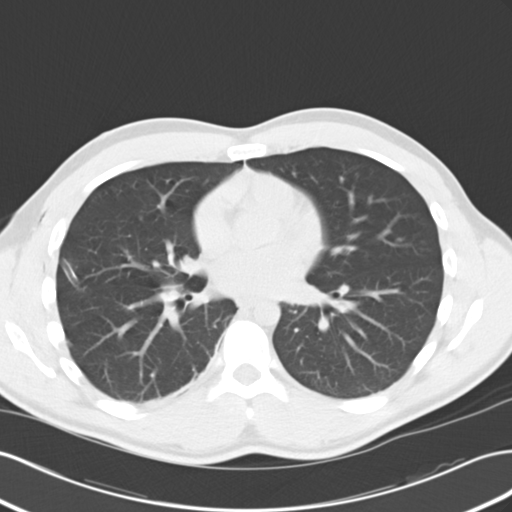
[im 32/63  lung]
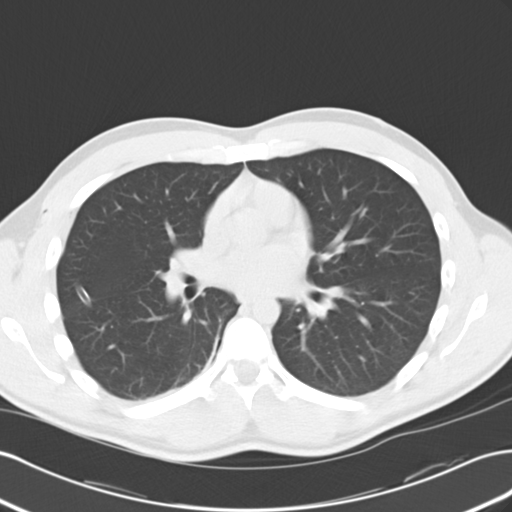
[im 34/63  mediastinal]
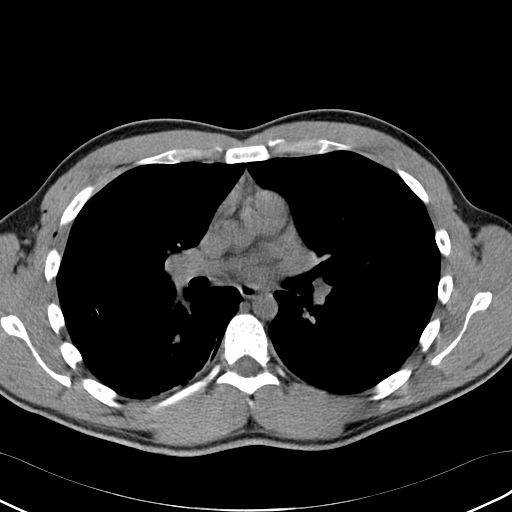
[im 34/63  lung]
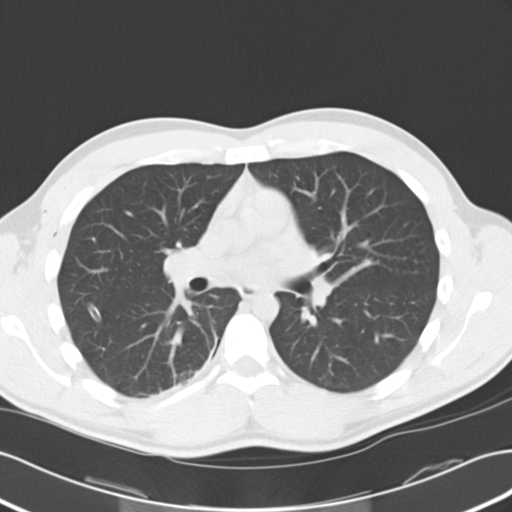
[im 39/63  lung]
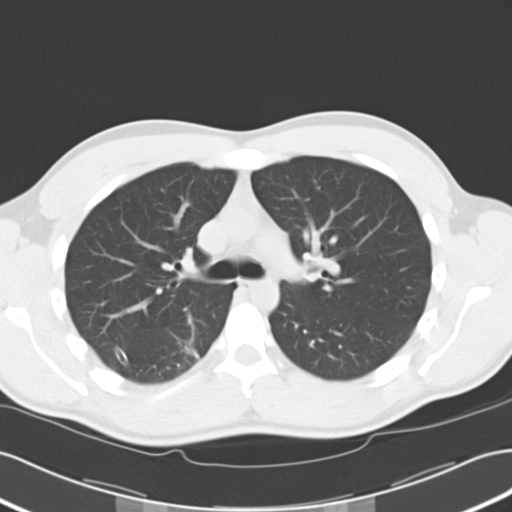
[im 43/63  lung]
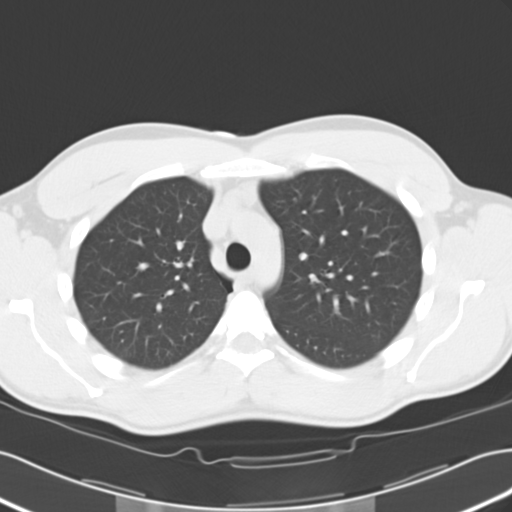
[im 48/63  lung]
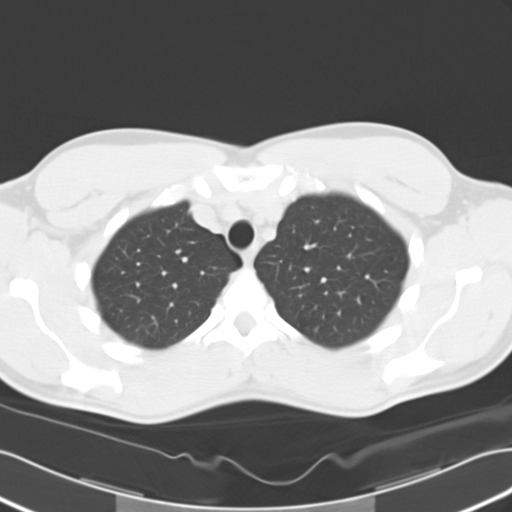
[im 53/63  mediastinal]
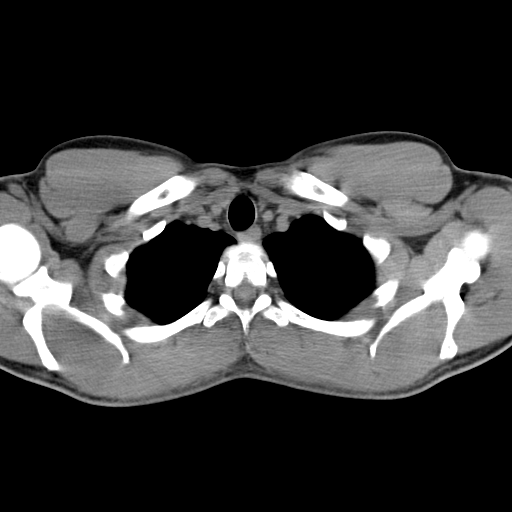
[im 53/63  lung]
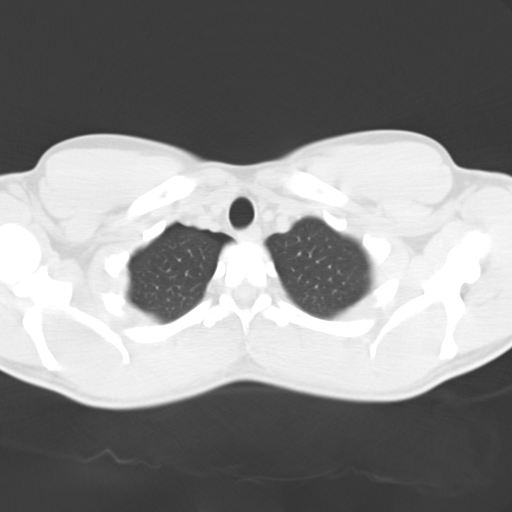
[im 58/63  lung]
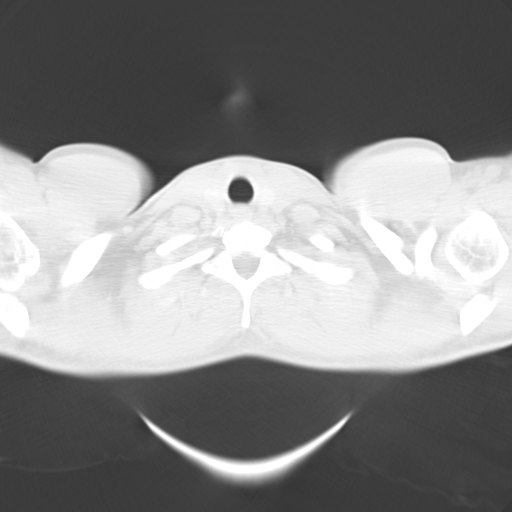

[14 of 32 positions shown; findings below may reference images not displayed]

FINDINGS: Mediastinum/Nodes: No pathologically enlarged mediastinal or
axillary lymph nodes. Prevascular soft tissue is most indicative of
thymus. Hilar regions are difficult to definitively evaluate without
IV contrast. Heart size normal. No pericardial effusion.

Lungs/Pleura: A small bore chest tube is seen in the right major
fissure. Trace right apical and medial pleural air. No definitive
blebs or bullous lesions are identified. Minimal atelectasis in the
medial right middle lobe and right lower lobe. Lungs are otherwise
clear. No pleural fluid. Airway is unremarkable.

Upper abdomen: Visualized portions of the liver, gallbladder,
adrenal glands, kidneys, spleen, pancreas and stomach are grossly
unremarkable. No upper abdominal adenopathy.

Musculoskeletal: No worrisome lytic or sclerotic lesions. Minimal
subcutaneous air along the right lateral chest wall.
IMPRESSION: Tiny residual right pneumothorax with right chest tube in place. No
definitive cause is identified.

## 2017-01-19 IMAGING — CR DG CHEST 1V PORT
1 series · 1 of 1 positions shown · non-contrast
Comparison: CT chest and single view of the chest 09/12/2015.

CLINICAL DATA: Status post blebectomy and VATS.

EXAM:
PORTABLE CHEST 1 VIEW

[ap]
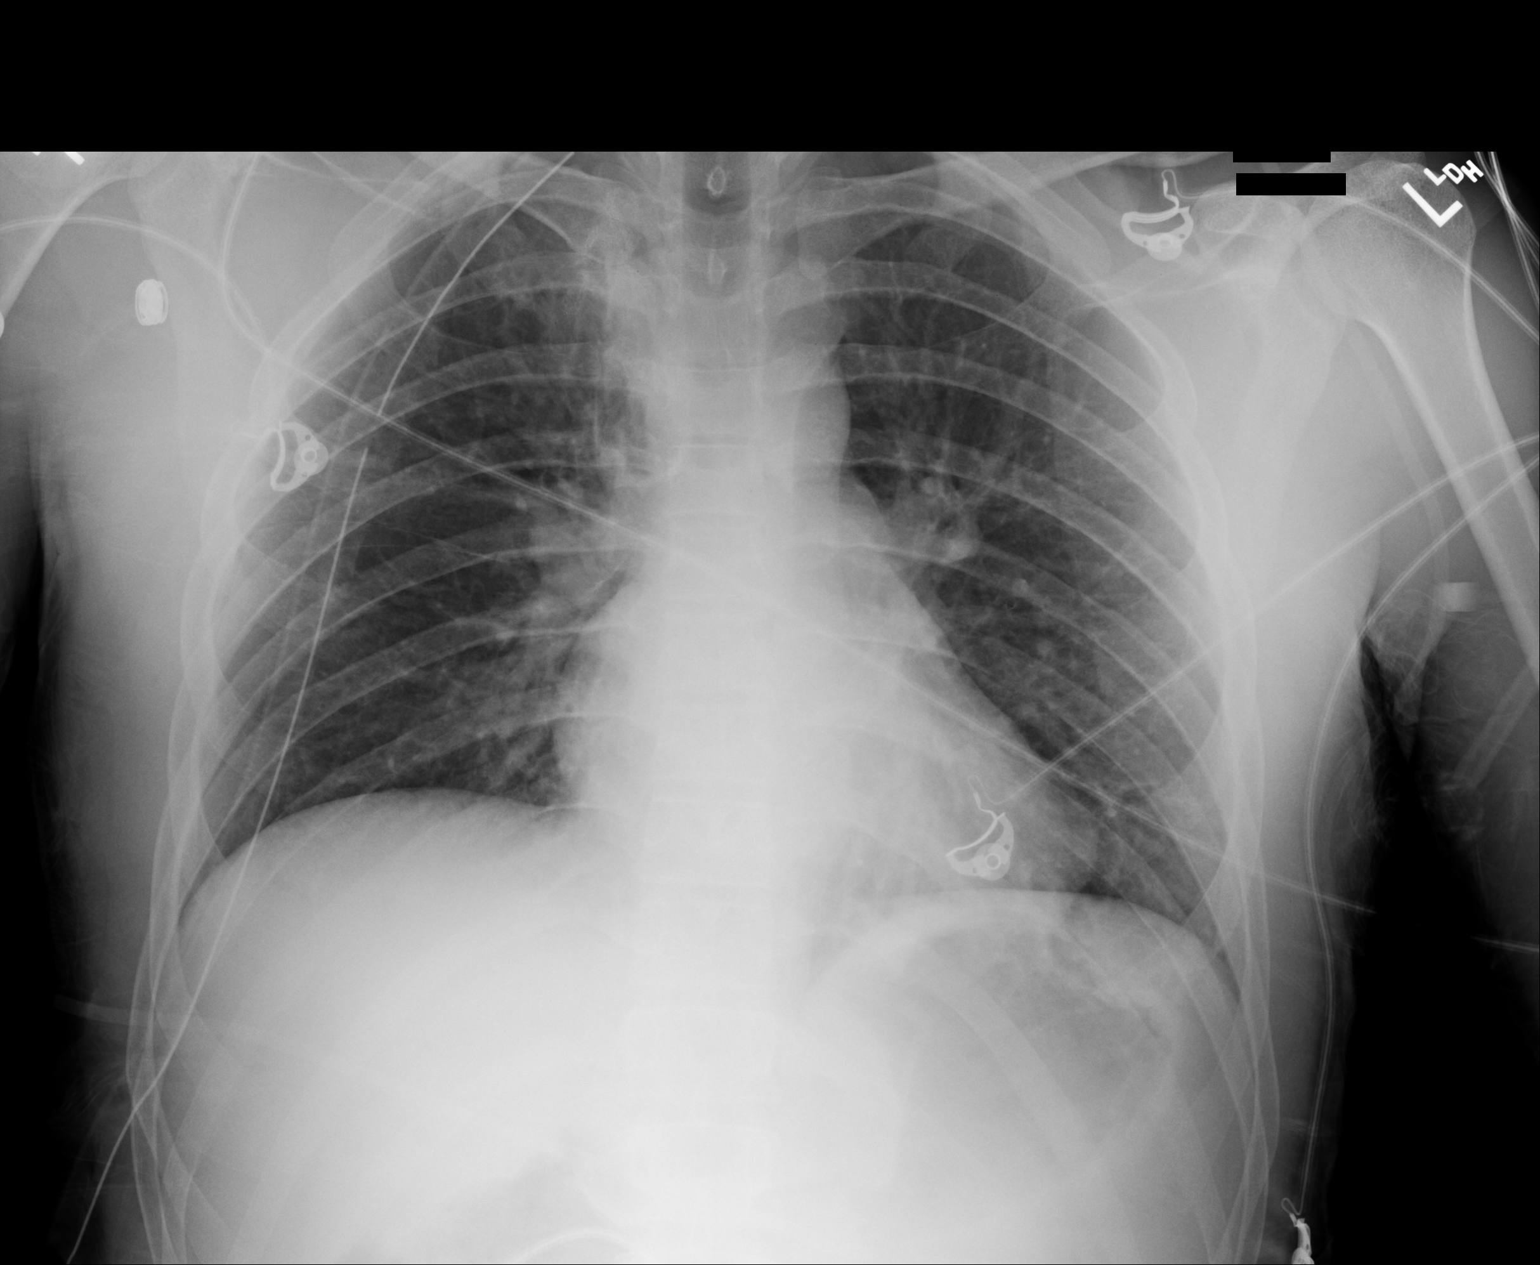

[1 of 1 positions shown; findings below may reference images not displayed]

FINDINGS: The patient has a new right chest tube in place. There may be a tiny
right apical pneumothorax. The left lung is expanded and clear.
Heart size is normal. No pleural effusion.
IMPRESSION: New right chest tube in place. There may be a tiny right apical
pneumothorax.

## 2017-01-22 IMAGING — CR DG CHEST 1V PORT
1 series · 1 of 1 positions shown · non-contrast
Comparison: Earlier today

CLINICAL DATA: Chest tube removal

EXAM:
PORTABLE CHEST 1 VIEW

[ap]
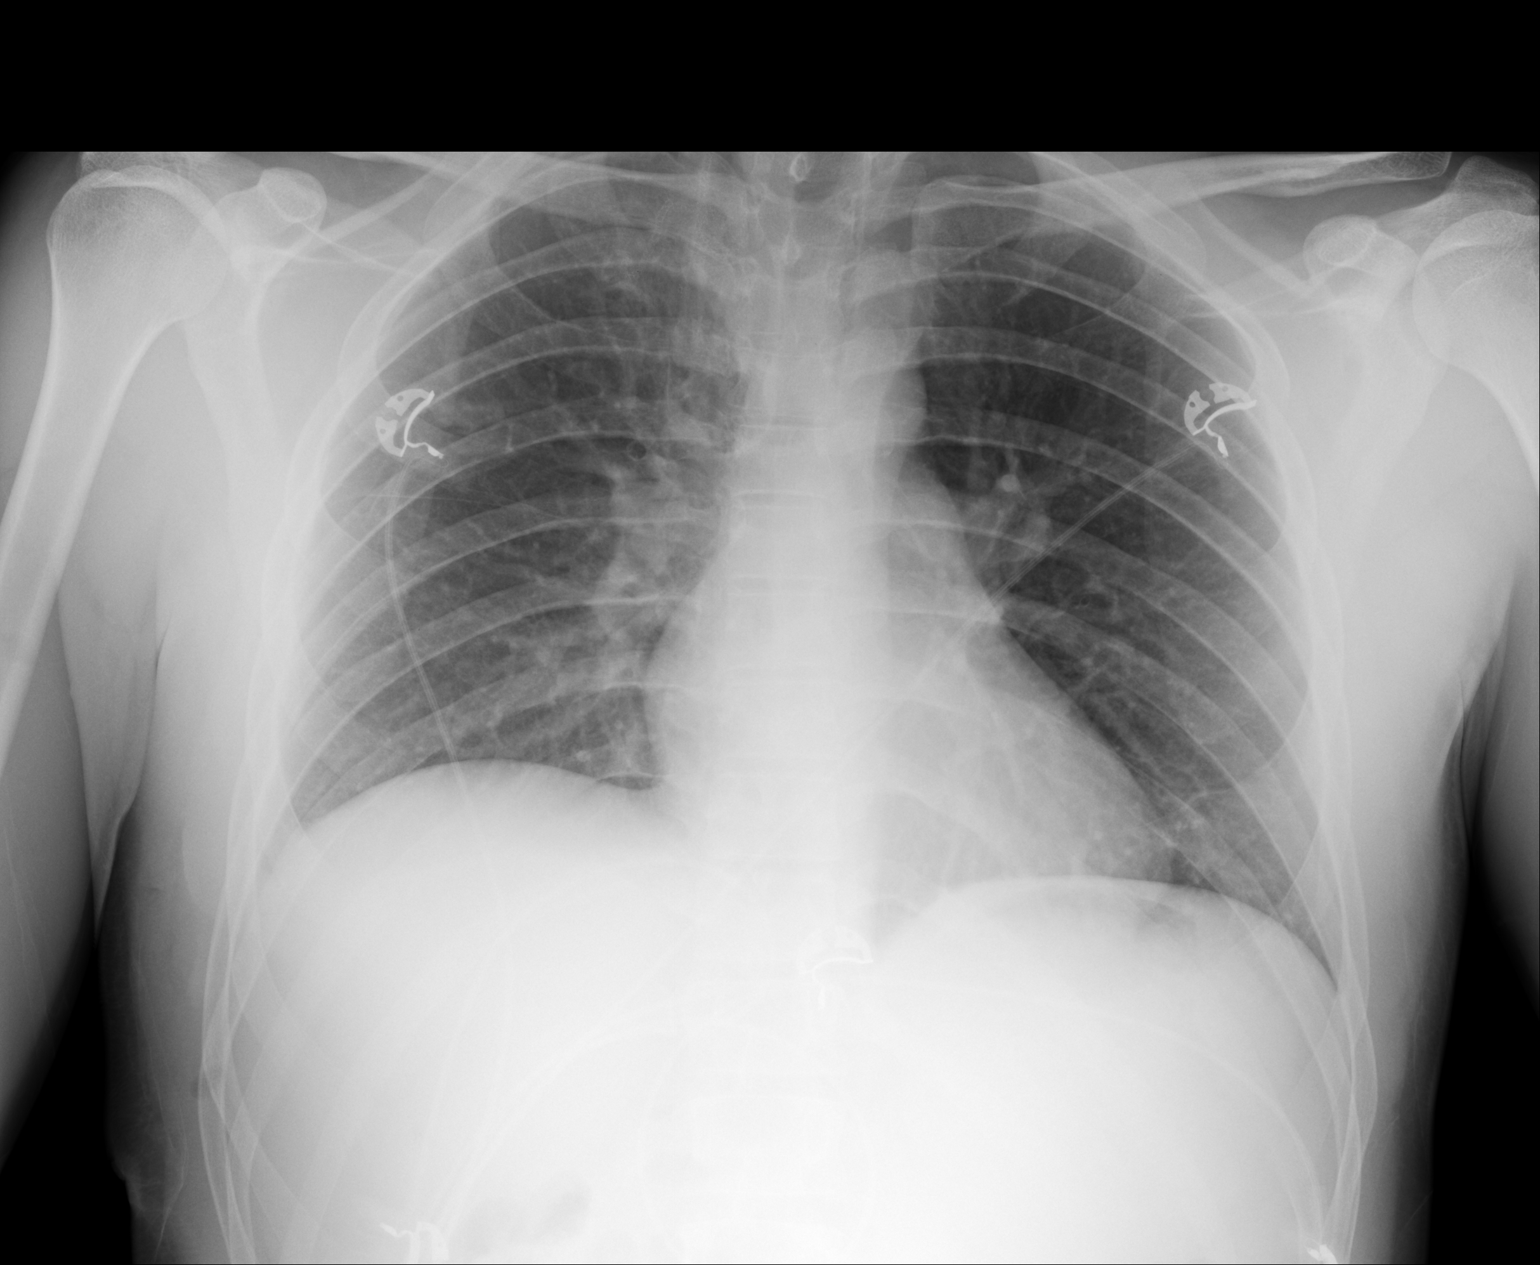

[1 of 1 positions shown; findings below may reference images not displayed]

FINDINGS: Chest tube has been removed with tiny right apical pneumothorax,
less than 5%. Surgical changes again noted at the right apex.

Normal heart size and mediastinal contours.  Clear lungs.
IMPRESSION: Trace right apical pneumothorax after chest tube removal.

## 2017-01-22 IMAGING — CR DG CHEST 2V
1 series · 3 of 3 positions shown · non-contrast
Comparison: Yesterday

CLINICAL DATA: History of pneumothorax

EXAM:
CHEST  2 VIEW

[Series 1: dg chest 2 view · 0.14mm/px · 3 of 3 slices shown]
[im 1/3]
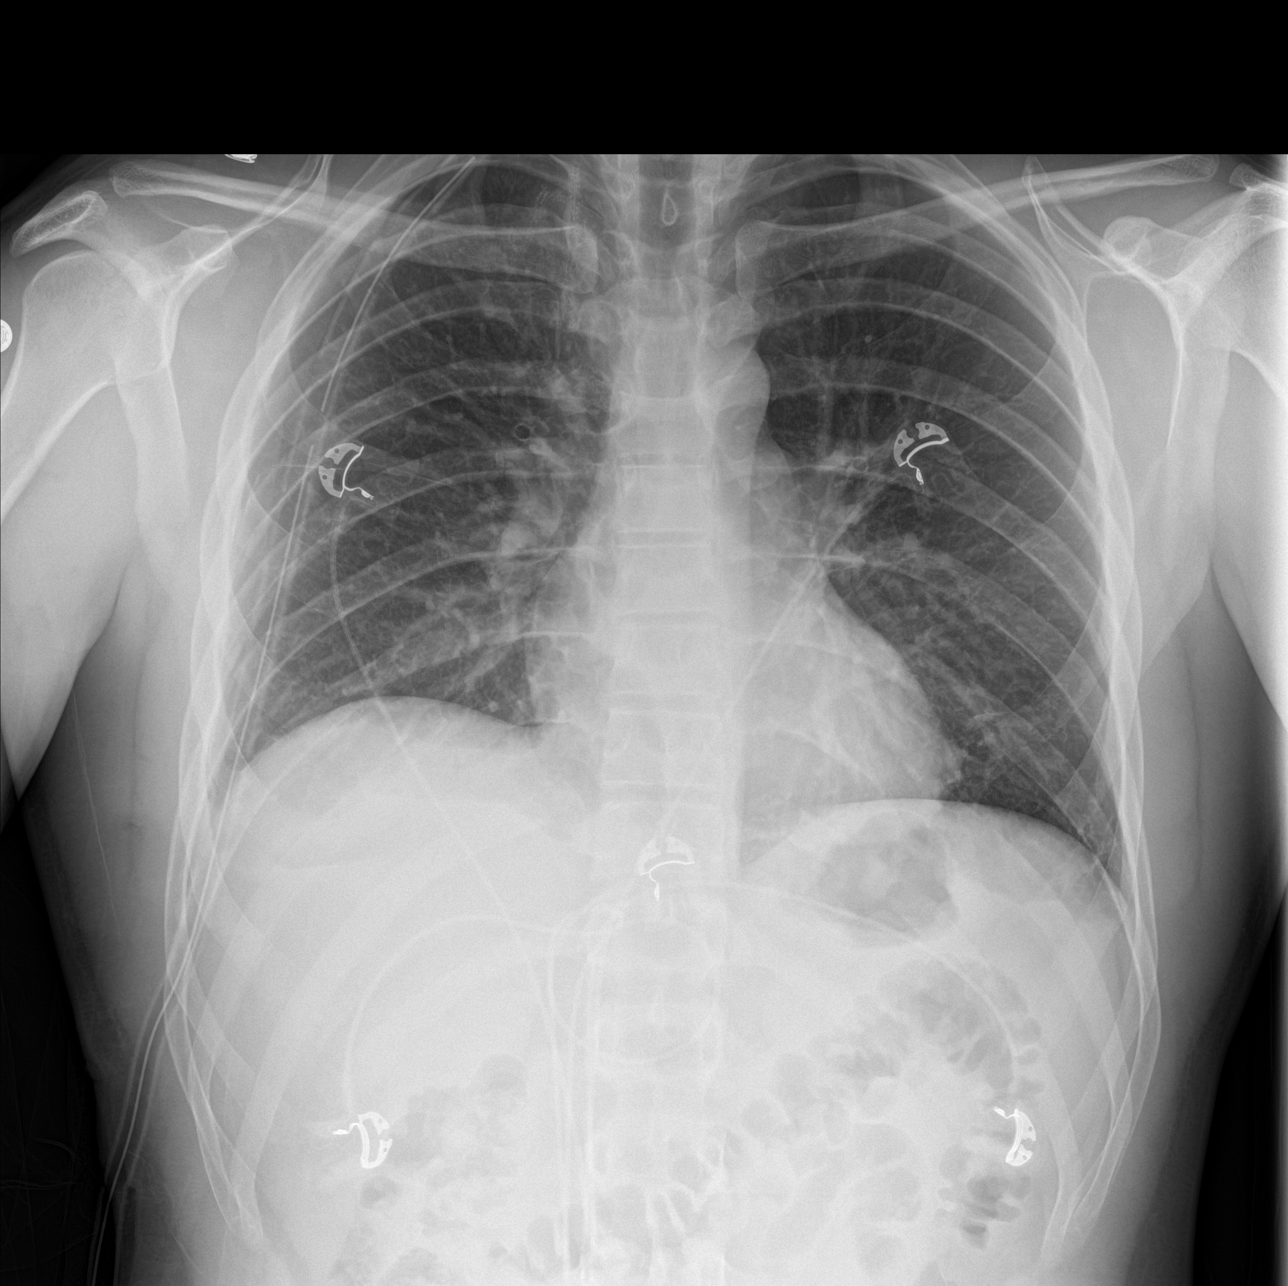
[im 2/3]
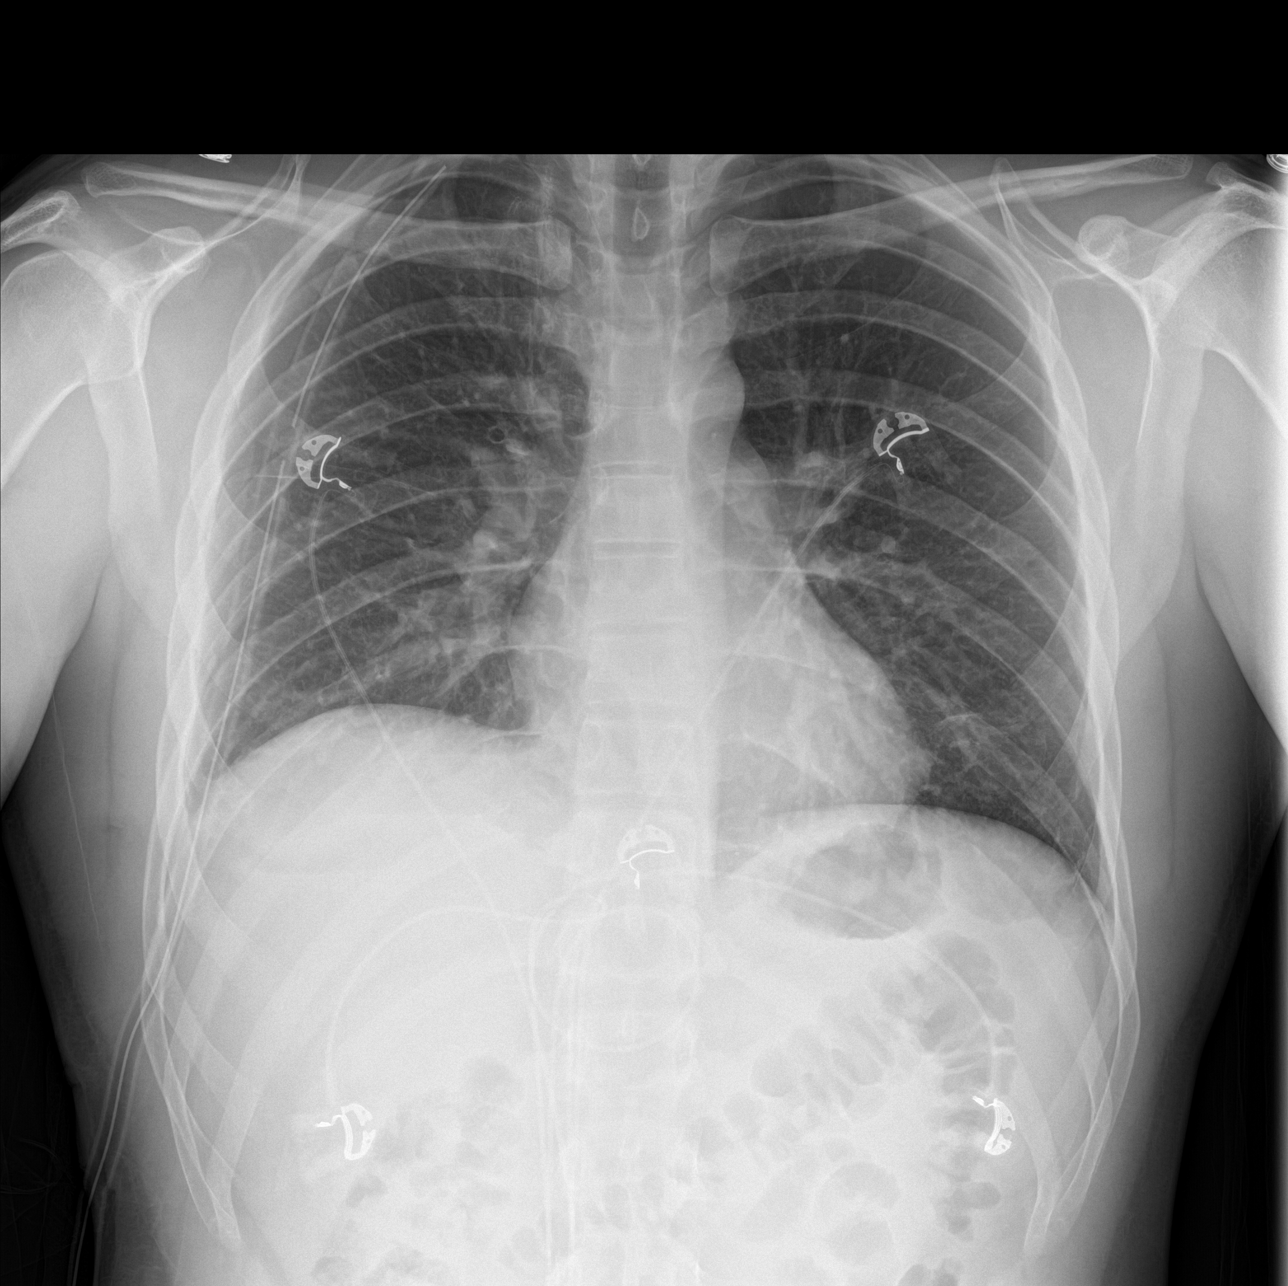
[im 3/3]
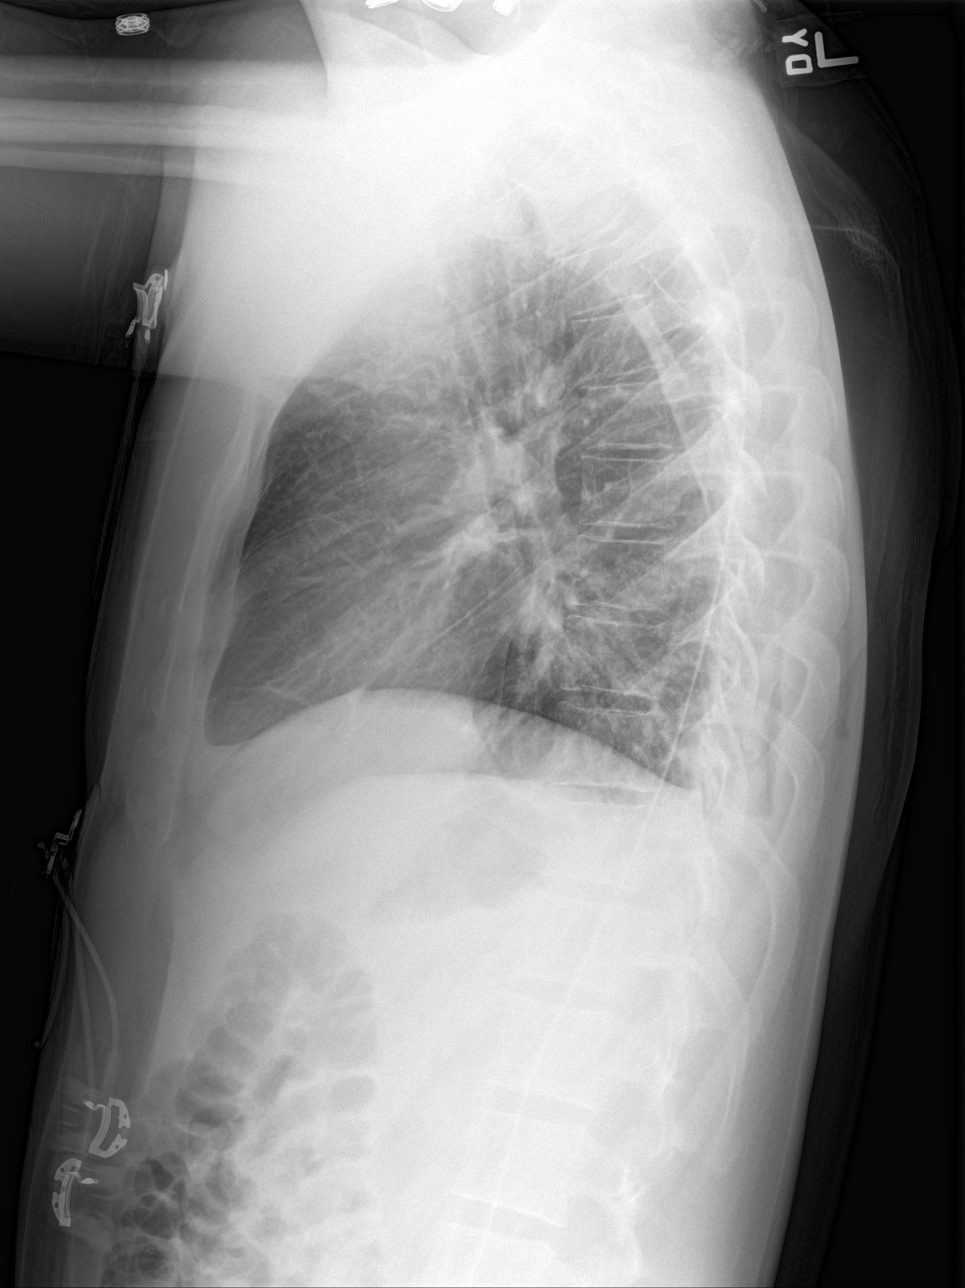

[3 of 3 positions shown; findings below may reference images not displayed]

FINDINGS: Right-sided chest tube in stable position. When allowing for pleural
interfaces at the right apex there is no convincing pneumothorax.
Trace right pleural effusion. No significant atelectasis. Normal
heart size and mediastinal contours.
IMPRESSION: Trace right pleural effusion.  No visible pneumothorax.

## 2019-06-11 ENCOUNTER — Other Ambulatory Visit: Payer: Self-pay

## 2019-06-11 DIAGNOSIS — Z20822 Contact with and (suspected) exposure to covid-19: Secondary | ICD-10-CM

## 2019-06-13 LAB — NOVEL CORONAVIRUS, NAA: SARS-CoV-2, NAA: DETECTED — AB
